# Patient Record
Sex: Male | Born: 1980 | Race: White | Hispanic: No | Marital: Married | Smoking: Never smoker
Health system: Southern US, Community
[De-identification: ages and names within clinical notes are randomized; demographics above are authoritative.]

---

## 2011-09-12 ENCOUNTER — Ambulatory Visit (INDEPENDENT_AMBULATORY_CARE_PROVIDER_SITE_OTHER): Payer: BC Managed Care – PPO | Admitting: Physician Assistant

## 2011-09-12 VITALS — BP 122/80 | HR 125 | Temp 100.8°F | Resp 18 | Ht 71.0 in | Wt 162.0 lb

## 2011-09-12 DIAGNOSIS — R509 Fever, unspecified: Secondary | ICD-10-CM

## 2011-09-12 DIAGNOSIS — J029 Acute pharyngitis, unspecified: Secondary | ICD-10-CM

## 2011-09-12 LAB — POCT RAPID STREP A (OFFICE): Rapid Strep A Screen: NEGATIVE

## 2011-09-12 MED ORDER — AMOXICILLIN 500 MG PO CAPS: ORAL_CAPSULE | ORAL | Status: DC

## 2011-09-12 NOTE — Progress Notes (Signed)
  Subjective:    Patient ID: Oscar Contreras, male    DOB: 1980-08-09, 31 y.o.   MRN: [id number]  HPI 31 yr old CM presents with a 2 day h/o ST and fever up to 103 degrees. +bodyaches. Has an almost 61 yr old daughter that is in daycare.  Review of Systems  All other systems reviewed and are negative.      Objective:   Physical Exam  Nursing note and vitals reviewed. Constitutional: He is oriented to person, place, and time. He appears well-developed and well-nourished.  HENT:  Head: Normocephalic and atraumatic.  Right Ear: External ear normal.  Left Ear: External ear normal.  Mouth/Throat: Oropharyngeal exudate (purulent exudate B, post pharynx open and patent) present.  Neck: Normal range of motion. Neck supple.  Cardiovascular: Regular rhythm, normal heart sounds and intact distal pulses.  Exam reveals no gallop and no friction rub.   No murmur heard.      Tachycardia secondary to fever.  Pulmonary/Chest: Effort normal and breath sounds normal.  Lymphadenopathy:    He has cervical adenopathy (B shotty and tender AC nodes.).  Neurological: He is alert and oriented to person, place, and time.  Skin: Skin is warm.  Psychiatric: He has a normal mood and affect. His behavior is normal.   Results for orders placed in visit on 09/12/11  POCT RAPID STREP A (OFFICE)      Component Value Range   Rapid Strep A Screen Negative  Negative       Assessment & Plan:  Pharyngitis-likely strep. Culturing. advil or tylenol. Salt water gargles. Fluids and rest.

## 2011-09-16 LAB — CULTURE, GROUP A STREP: Organism ID, Bacteria: NORMAL

## 2013-02-28 ENCOUNTER — Other Ambulatory Visit: Payer: Self-pay | Admitting: Family Medicine

## 2013-02-28 DIAGNOSIS — K4090 Unilateral inguinal hernia, without obstruction or gangrene, not specified as recurrent: Secondary | ICD-10-CM

## 2013-03-04 ENCOUNTER — Ambulatory Visit
Admission: RE | Admit: 2013-03-04 | Discharge: 2013-03-04 | Disposition: A | Discharge status: Home / Self Care | Payer: BC Managed Care – PPO | Source: Ambulatory Visit | Attending: Family Medicine | Admitting: Family Medicine

## 2013-03-04 DIAGNOSIS — K4090 Unilateral inguinal hernia, without obstruction or gangrene, not specified as recurrent: Secondary | ICD-10-CM

## 2015-06-14 IMAGING — US US SCROTUM
1 series · 14 of 25 positions shown · non-contrast
Comparison: None.

CLINICAL DATA: 32-year-old male with abdominal pain radiating to
the left scrotum. Initial encounter. Query scrotal hernia or cyst.

EXAM:
ULTRASOUND OF SCROTUM
TECHNIQUE: Complete ultrasound examination of the testicles, epididymis, and
other scrotal structures was performed.

[Series 1: us scrotum · 0.08mm/px · 46 acquisitions, 14 frames shown]
[im 1/46]
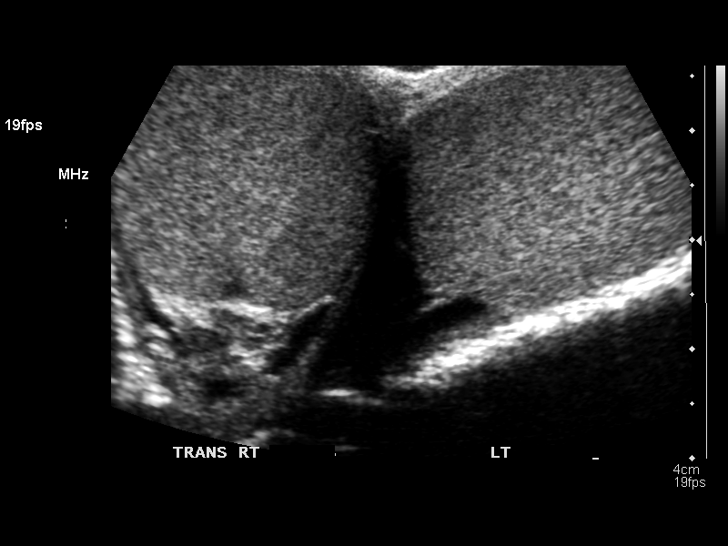
[im 4/46]
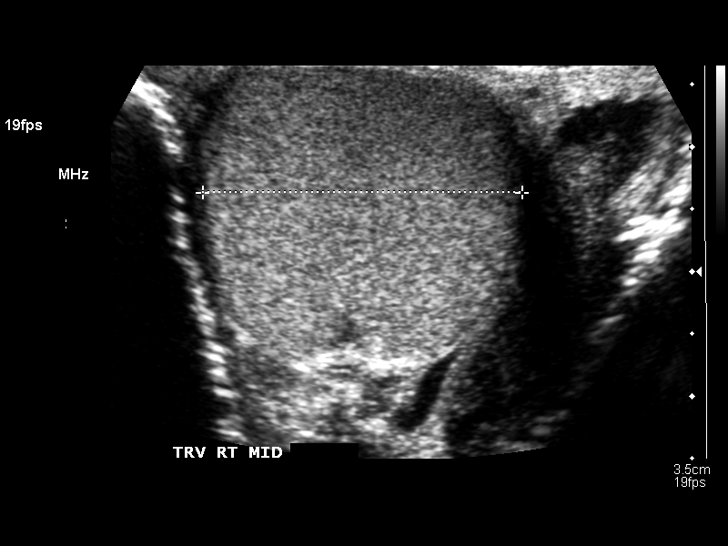
[im 8/46]
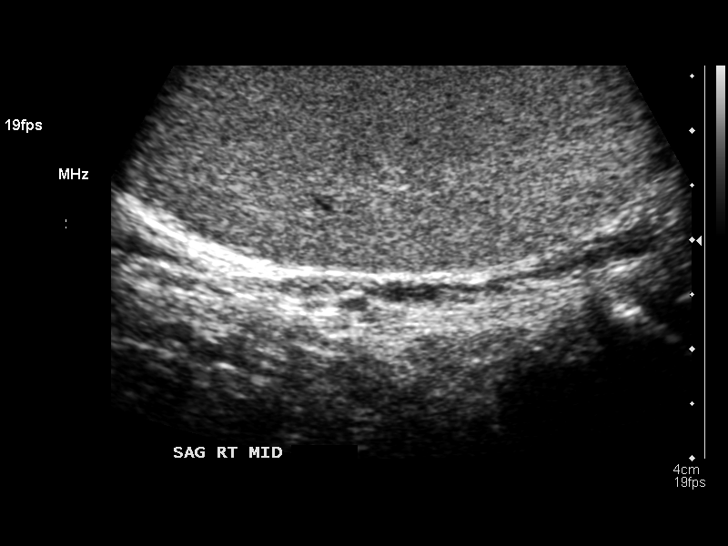
[im 12/46]
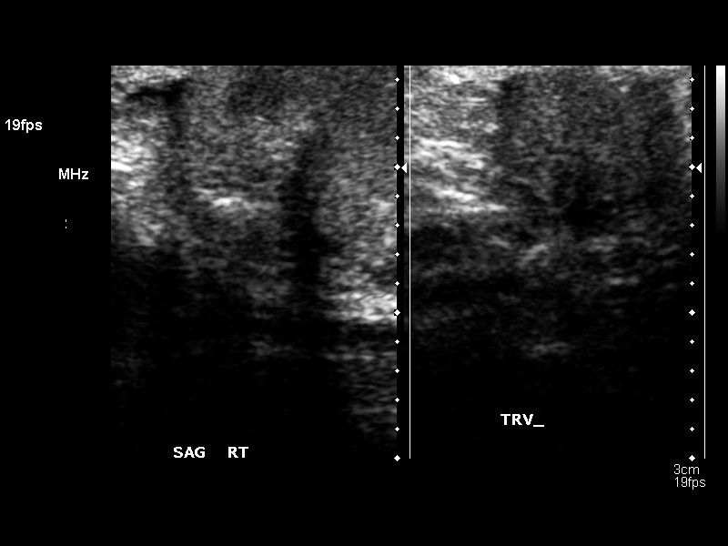
[im 16/46]
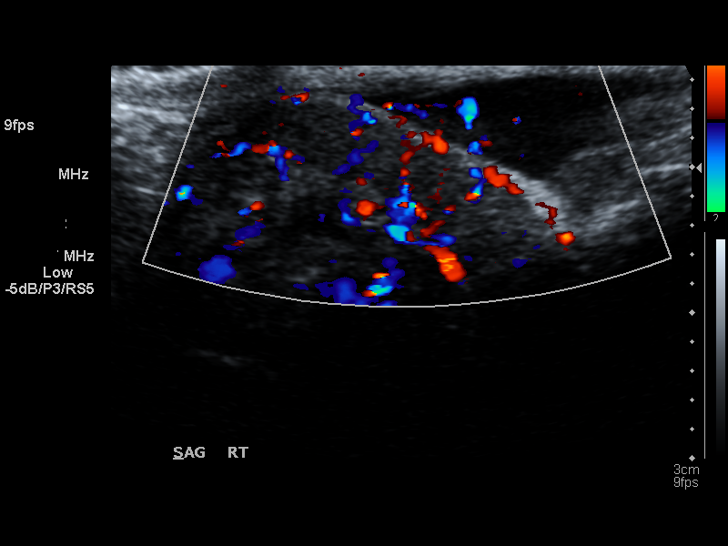
[im 17/46]
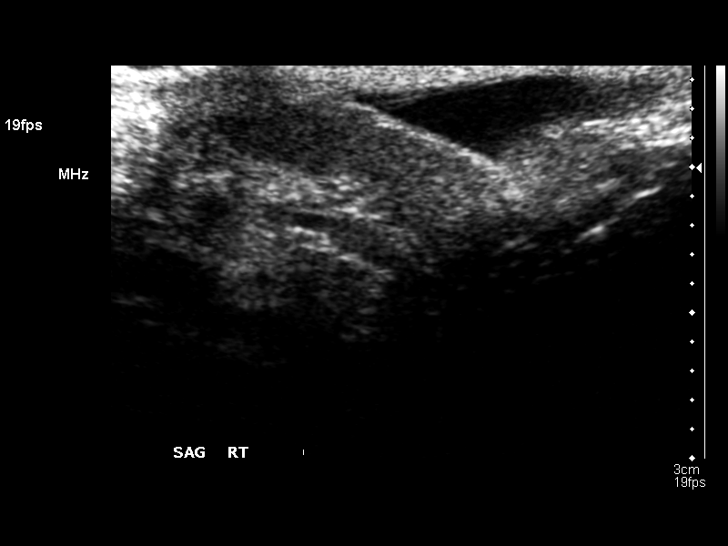
[im 21/46]
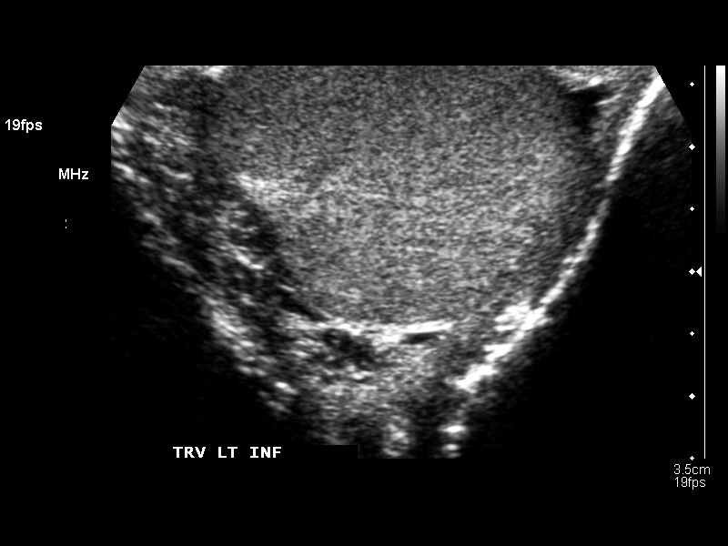
[im 25/46]
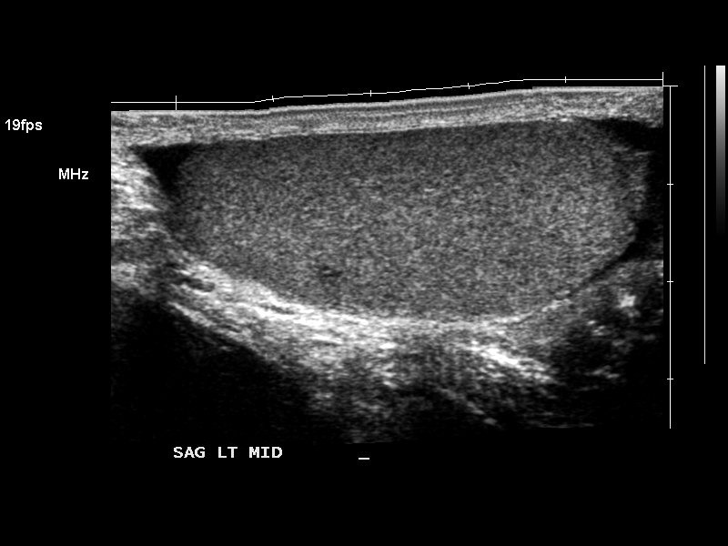
[im 29/46]
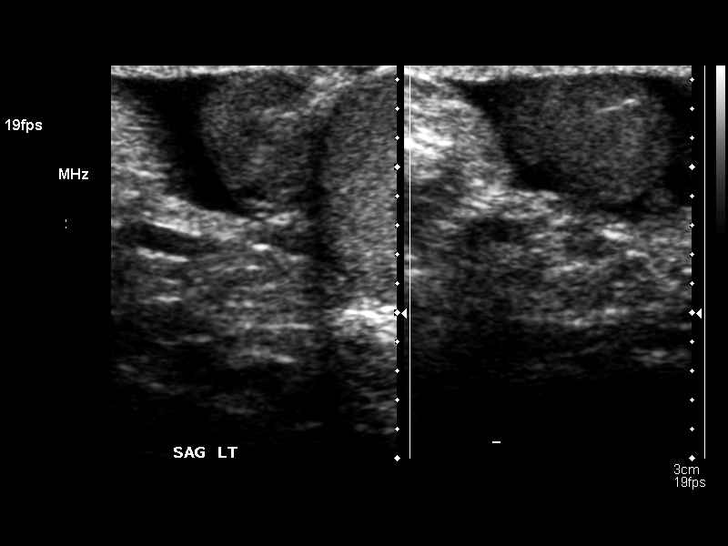
[im 31/46]
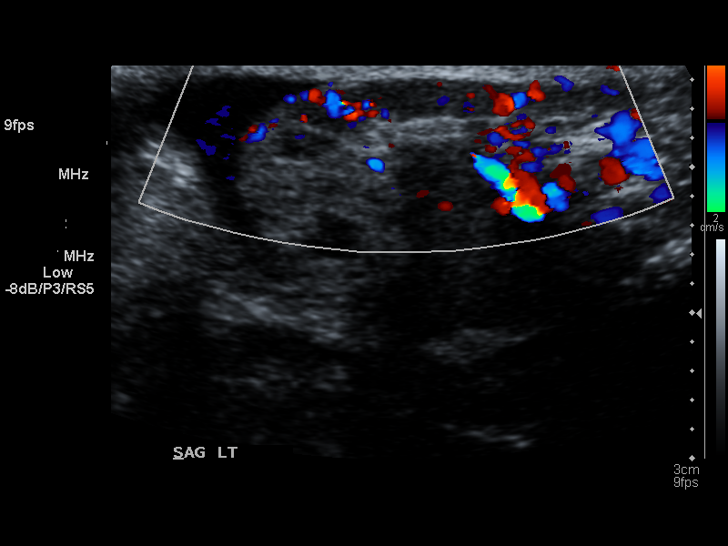
[im 34/46]
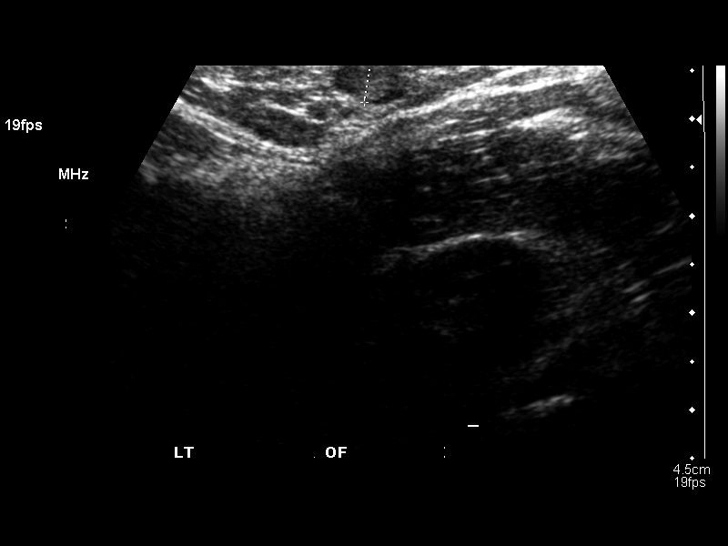
[im 38/46]
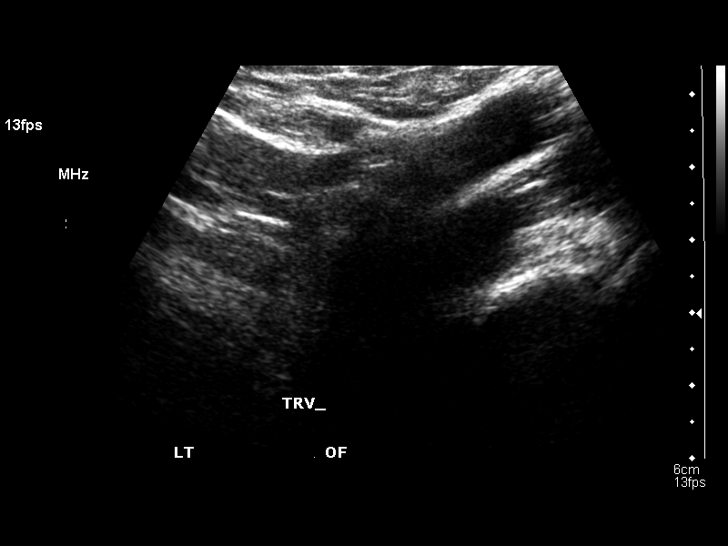
[im 42/46]
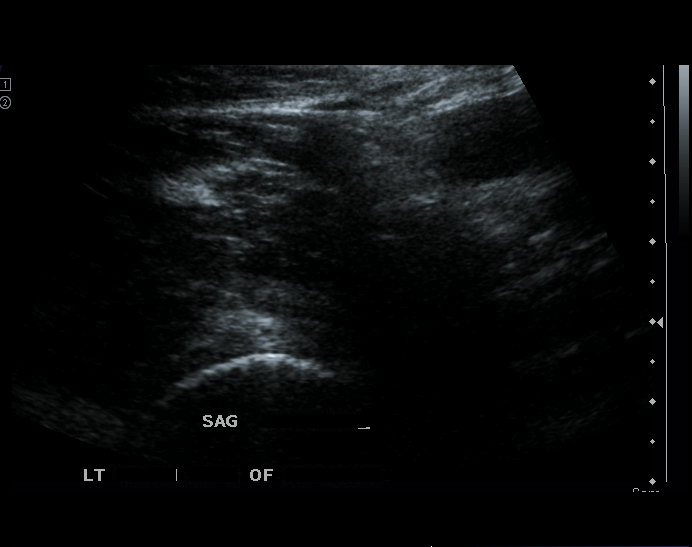
[im 46/46]
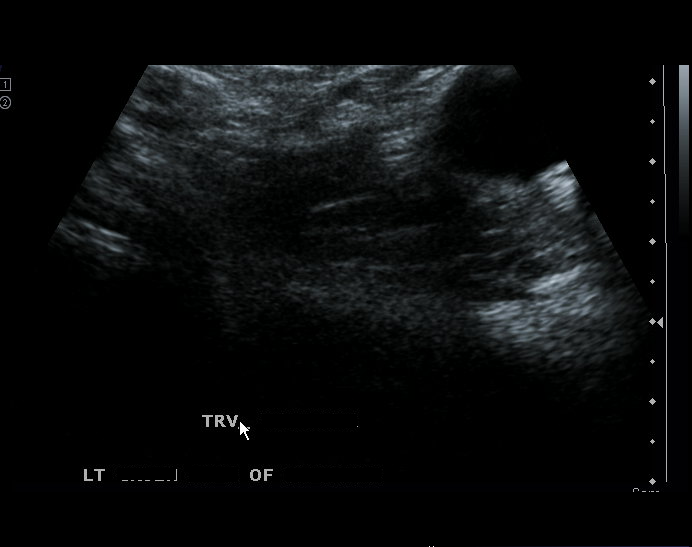

[14 of 25 positions shown; findings below may reference images not displayed]

FINDINGS: Right testicle

Measurements: 4.9 x 2.1 x 2.6 cm. No mass or microlithiasis
visualized.

Left testicle

Measurements: 4.9 x 2.0 x 3.2 cm. No mass or microlithiasis
visualized.

Right epididymis:  Normal in size and appearance.

Left epididymis:  Normal in size and appearance.

Hydrocele:  Trace bilateral.

Varicocele:  None visualized.

Other findings: In the left groin area of clinical concern there are
normal inguinal lymph nodes. Normal fibromuscular architecture. No
cystic or solid mass. No changes with Valsalva. No hernia
identified.
IMPRESSION: 1. Negative scrotal ultrasound.
2. No abnormality of the left groin. No inguinal hernia or mass
identified.

## 2019-08-11 ENCOUNTER — Ambulatory Visit
Admission: EM | Admit: 2019-08-11 | Discharge: 2019-08-11 | Disposition: A | Discharge status: Home / Self Care | Payer: 59 | Attending: Physician Assistant | Admitting: Physician Assistant

## 2019-08-11 ENCOUNTER — Ambulatory Visit (INDEPENDENT_AMBULATORY_CARE_PROVIDER_SITE_OTHER): Payer: 59

## 2019-08-11 ENCOUNTER — Encounter: Payer: Self-pay | Admitting: Physician Assistant

## 2019-08-11 ENCOUNTER — Other Ambulatory Visit: Payer: Self-pay

## 2019-08-11 DIAGNOSIS — S6982XA Other specified injuries of left wrist, hand and finger(s), initial encounter: Secondary | ICD-10-CM | POA: Diagnosis not present

## 2019-08-11 DIAGNOSIS — M79645 Pain in left finger(s): Secondary | ICD-10-CM | POA: Diagnosis not present

## 2019-08-11 NOTE — ED Provider Notes (Signed)
[facility]    CSN: [id number] Arrival date & time: 08/11/19  1530      History   Chief Complaint Chief Complaint  Patient presents with  . Hand Pain    HPI Oscar Contreras is a 39 y.o. male.   39 year old male comes in for 3 week history of left middle finger pain after injury. At the time, finger was wrapped up in a towel that was caught in a spinning drill, causing twisting motion of the finger. Since then has had pain and swelling to the left middle finger. No pain at rest, pain with ROM. Given swelling has not improved, tried to use finger splint, for which caused stiffness. RHD     History reviewed. No pertinent past medical history.  There are no problems to display for this patient.   History reviewed. No pertinent surgical history.     Home Medications    Prior to Admission medications   Not on File    Family History History reviewed. No pertinent family history.  Social History Social History   Tobacco Use  . Smoking status: Never Smoker  Substance Use Topics  . Alcohol use: Not on file  . Drug use: Not on file     Allergies   Patient has no known allergies.   Review of Systems Review of Systems  Reason unable to perform ROS: See HPI as above.     Physical Exam Triage Vital Signs ED Triage Vitals  Enc Vitals Group     BP 08/11/19 1650 (!) 156/104     Pulse Rate 08/11/19 1650 90     Resp 08/11/19 1650 18     Temp 08/11/19 1650 97.9 F (36.6 C)     Temp Source 08/11/19 1650 Oral     SpO2 08/11/19 1650 98 %     Weight --      Height --      Head Circumference --      Peak Flow --      Pain Score 08/11/19 1648 0     Pain Loc --      Pain Edu? --      Excl. in GC? --    No data found.  Updated Vital Signs BP (!) 156/104 (BP Location: Right Arm)   Pulse 90   Temp 97.9 F (36.6 C) (Oral)   Resp 18   SpO2 98%   Physical Exam Constitutional:      General: He is not in acute distress.    Appearance: Normal  appearance. He is well-developed. He is not toxic-appearing or diaphoretic.  HENT:     Head: Normocephalic and atraumatic.  Eyes:     Conjunctiva/sclera: Conjunctivae normal.     Pupils: Pupils are equal, round, and reactive to light.  Pulmonary:     Effort: Pulmonary effort is normal. No respiratory distress.  Musculoskeletal:     Cervical back: Normal range of motion and neck supple.     Comments: Swelling to the PIP joint of left middle finger, no contusion seen. No tenderness to palpation of the finger. Slightly decreased flexion to the area. Strength 5/5. NVI  Skin:    General: Skin is warm and dry.  Neurological:     Mental Status: He is alert and oriented to person, place, and time.      UC Treatments / Results  Labs (all labs ordered are listed, but only abnormal results are displayed) Labs Reviewed - No data to display  EKG   Radiology DG Finger Middle Left  Result Date: 08/11/2019 CLINICAL DATA:  39 year old male with trauma to the left middle finger. EXAM: LEFT MIDDLE FINGER 2+V COMPARISON:  None. FINDINGS: There is no evidence of fracture or dislocation. There is no evidence of arthropathy or other focal bone abnormality. Soft tissues are unremarkable. IMPRESSION: Negative. Electronically Signed   By: Elgie Collard M.D.   On: 08/11/2019 17:35    Procedures Procedures (including critical care time)  Medications Ordered in UC Medications - No data to display  Initial Impression / Assessment and Plan / UC Course  I have reviewed the triage vital signs and the nursing notes.  Pertinent labs & imaging results that were available during my care of the patient were reviewed by me and considered in my medical decision making (see chart for details).    Xray negative. Finger splint, NSAIDs, ice compress. To follow up with hand orthopedics if symptoms still not improving.  Final Clinical Impressions(s) / UC Diagnoses   Final diagnoses:  Finger pain, left     ED Prescriptions    None     PDMP not reviewed this encounter.   Belinda Fisher, PA-C 08/11/19 1801

## 2019-08-11 NOTE — Discharge Instructions (Signed)
As discussed, will give you a call if xray positive. Otherwise, continue finger splint with ibuprofen 800mg  three times day or naproxen 440mg  twice a day for 5-7 days. Follow up with hand orthopedics if symptoms not improving.

## 2019-08-11 NOTE — ED Triage Notes (Addendum)
Patient presents to [facility] for assessment of left middle finger pain x 3 weeks after he got his hand caught in a towel being spun around/wrapped up by a drill/  Swelling noted at triage.  Patient states it was doing okay for 2 weeks, but then he decided to splint it 5 days ago, and the stiffness/decreased ROM/pain worsened

## 2021-11-20 IMAGING — DX DG FINGER MIDDLE 2+V*L*
3 series · 3 of 3 positions shown · non-contrast
Comparison: None.

CLINICAL DATA: 39-year-old male with trauma to the left middle
finger.

EXAM:
LEFT MIDDLE FINGER 2+V

[finger pa (1 of 2)]
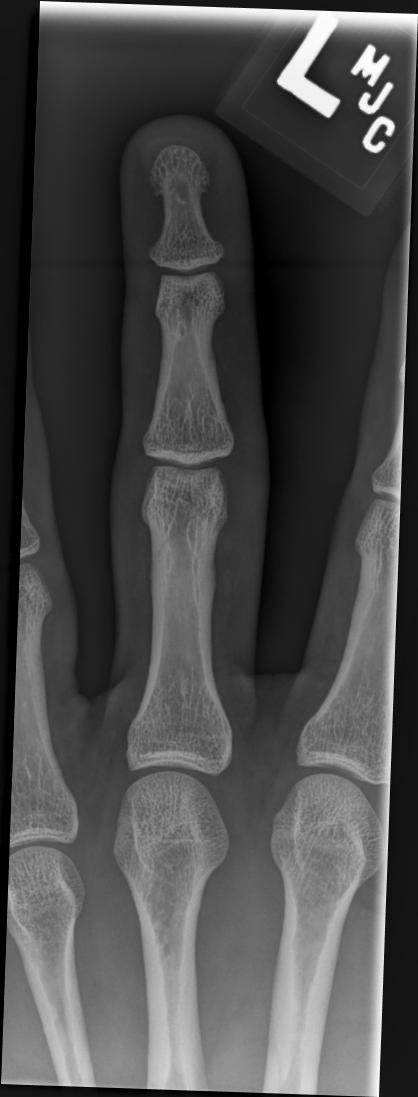

[finger pa (2 of 2)]
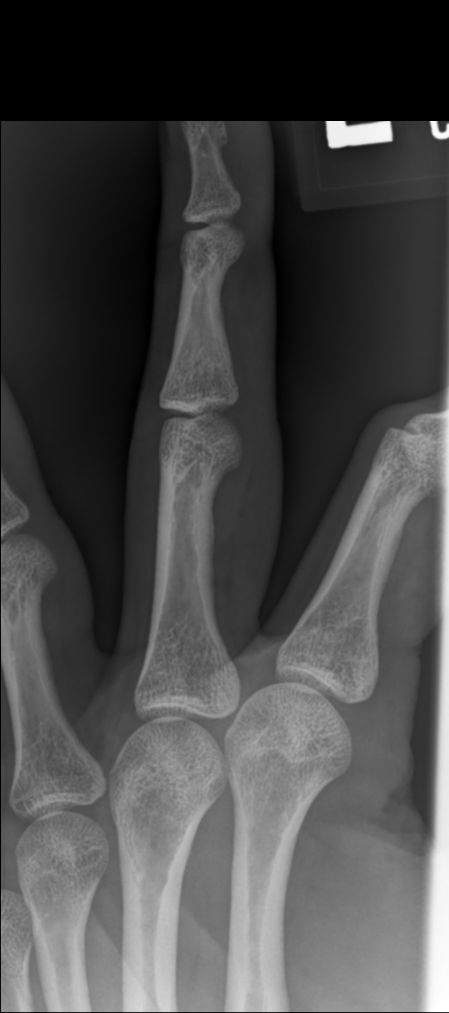

[finger lat]
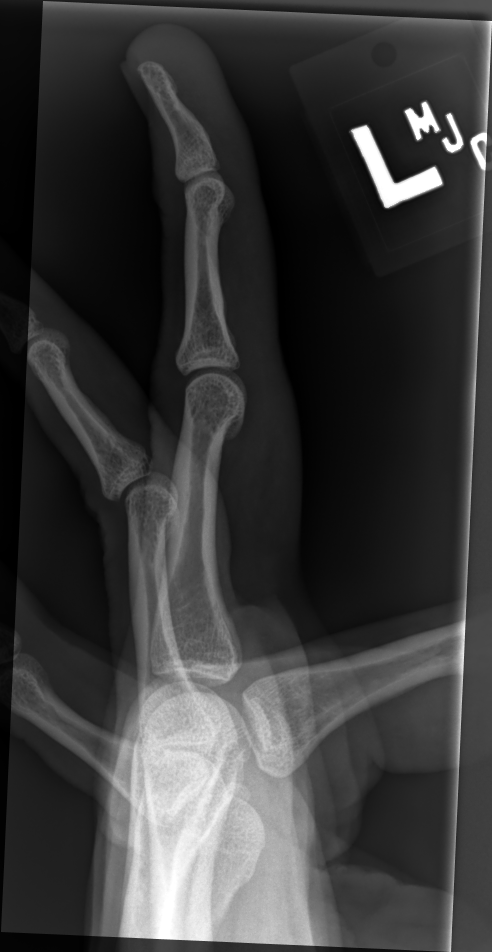

[3 of 3 positions shown; findings below may reference images not displayed]

FINDINGS: There is no evidence of fracture or dislocation. There is no
evidence of arthropathy or other focal bone abnormality. Soft
tissues are unremarkable.
IMPRESSION: Negative.

## 2022-03-08 ENCOUNTER — Encounter: Payer: Self-pay | Admitting: Family Medicine

## 2022-03-09 ENCOUNTER — Ambulatory Visit: Payer: No Typology Code available for payment source | Admitting: Family Medicine

## 2022-03-09 ENCOUNTER — Encounter: Payer: Self-pay | Admitting: Family Medicine

## 2022-03-09 VITALS — BP 118/90 | HR 90 | Temp 97.5°F | Resp 16 | Ht 71.0 in | Wt 199.0 lb

## 2022-03-09 DIAGNOSIS — Z136 Encounter for screening for cardiovascular disorders: Secondary | ICD-10-CM | POA: Diagnosis not present

## 2022-03-09 DIAGNOSIS — Z23 Encounter for immunization: Secondary | ICD-10-CM

## 2022-03-09 DIAGNOSIS — Z1329 Encounter for screening for other suspected endocrine disorder: Secondary | ICD-10-CM

## 2022-03-09 DIAGNOSIS — R030 Elevated blood-pressure reading, without diagnosis of hypertension: Secondary | ICD-10-CM | POA: Diagnosis not present

## 2022-03-09 DIAGNOSIS — N201 Calculus of ureter: Secondary | ICD-10-CM

## 2022-03-09 DIAGNOSIS — Z0001 Encounter for general adult medical examination with abnormal findings: Secondary | ICD-10-CM

## 2022-03-09 DIAGNOSIS — Z13228 Encounter for screening for other metabolic disorders: Secondary | ICD-10-CM

## 2022-03-09 DIAGNOSIS — Z130 Encounter for screening for diseases of the blood and blood-forming organs and certain disorders involving the immune mechanism: Secondary | ICD-10-CM

## 2022-03-09 DIAGNOSIS — Z1321 Encounter for screening for nutritional disorder: Secondary | ICD-10-CM

## 2022-03-09 DIAGNOSIS — R058 Other specified cough: Secondary | ICD-10-CM

## 2022-03-09 DIAGNOSIS — T592X1A Toxic effect of formaldehyde, accidental (unintentional), initial encounter: Secondary | ICD-10-CM

## 2022-03-09 NOTE — Progress Notes (Signed)
Assessment  Assessment/Plan:   Problem List Items Addressed This Visit       Other   Elevated blood pressure reading in office without diagnosis of hypertension - Primary    The patient presented with an initial elevated BP reading of 90 diastolic during the visit. Recommend check at home. If remains elevated, consider a follow-up visit for BP management and guideline-directed medical therapy initiation if hypertension is diagnosed.      Relevant Orders   TSH   Lipid panel   Hemoglobin A1c   Microalbumin / creatinine urine ratio   Urinalysis, Routine w reflex microscopic   Vitamin D 1,25 dihydroxy   CBC with Differential/Platelet   Comprehensive metabolic panel   Encounter for well adult exam with abnormal findings   Relevant Orders   TSH   Lipid panel   Hemoglobin A1c   Microalbumin / creatinine urine ratio   Urinalysis, Routine w reflex microscopic   Vitamin D 1,25 dihydroxy   CBC with Differential/Platelet   Comprehensive metabolic panel   Tdap vaccine greater than or equal to 7yo IM (Completed)   Toxic effect of formalin fumes    Due to occupational exposure to formaldehyde, a discussion on cancer surveillance briefly discussed.  Given improving respiratory symptoms, no previous sign of respiratory disease on exam.  Recommend continued monitoring, with low threshold to refer to pulmonology or to get advanced lung imaging.      Other Visit Diagnoses     Screening for endocrine, nutritional, metabolic and immunity disorder       Relevant Orders   TSH   Lipid panel   Hemoglobin A1c   Microalbumin / creatinine urine ratio   Urinalysis, Routine w reflex microscopic   Vitamin D 1,25 dihydroxy   CBC with Differential/Platelet   Comprehensive metabolic panel   Screening for heart disease       Relevant Orders   TSH   Lipid panel   Hemoglobin A1c   Microalbumin / creatinine urine ratio   Urinalysis, Routine w reflex microscopic   Vitamin D 1,25 dihydroxy   CBC  with Differential/Platelet   Comprehensive metabolic panel   Immunization due       Relevant Orders   Tdap vaccine greater than or equal to 7yo IM (Completed)   Ureterolithiasis       Relevant Orders   TSH   Lipid panel   Hemoglobin A1c   Microalbumin / creatinine urine ratio   Urinalysis, Routine w reflex microscopic   Vitamin D 1,25 dihydroxy   CBC with Differential/Platelet   Comprehensive metabolic panel   Post-viral cough syndrome           Medications Discontinued During This Encounter  Medication Reason   loratadine (CLARITIN) 10 MG tablet     Patient Counseling(The following topics were reviewed and/or handout was given):  -Nutrition: Stressed importance of moderation in sodium/caffeine intake, saturated fat and cholesterol, caloric balance, sufficient intake of fresh fruits, vegetables, and fiber.  -Stressed the importance of regular exercise.   -Substance Abuse: Discussed cessation/primary prevention of tobacco, alcohol, or other drug use; driving or other dangerous activities under the influence; availability of treatment for abuse.   -Injury prevention: Discussed safety belts, safety helmets, smoke detector, smoking near bedding or upholstery.   -Sexuality: Discussed sexually transmitted diseases, partner selection, use of condoms, avoidance of unintended pregnancy and contraceptive alternatives.   -Dental health: Discussed importance of regular tooth brushing, flossing, and dental visits.  -Health maintenance and immunizations reviewed. Please  refer to Health maintenance section.  Return to care in 1 year for next preventative visit.       Subjective:  Chief complaint Encounter date: 03/09/2022  Chief Complaint  Patient presents with   Establish Care    No questions or concerns-  Tdap needed    Chief Complaint: Physical and Establish Care.  History of Present Illness:   Problem 1: The patient mentions post-viral cough since around Thanksgiving which is  intermittently productive but is improving.  Patient denies recent regular physical activity.   Review of Systems: All systems are reviewed with negative findings except for the aforementioned intermittent productive cough post respiratory illness. The patient denies headache, blurry vision, chest pain, shortness of breath, or any other systemic complaints indicating no acute issues at the time of this visit.  Health Maintenance:  Up-to-date on dental health, encouraged to continue annual dental visits. No recent evaluation for vision, recommended to follow up as needed.      No data to display            03/09/2022    3:45 PM  Depression screen PHQ 2/9  Decreased Interest 0  Down, Depressed, Hopeless 0  PHQ - 2 Score 0    There are no preventive care reminders to display for this patient.   PMH:  The following were reviewed and entered/updated in epic: History reviewed. No pertinent past medical history.  Patient Active Problem List   Diagnosis Date Noted   Elevated blood pressure reading in office without diagnosis of hypertension 03/12/2022   Encounter for well adult exam with abnormal findings 03/12/2022   Toxic effect of formalin fumes 03/12/2022    History reviewed. No pertinent surgical history.  Family History  Problem Relation Age of Onset   Cancer Maternal Grandfather    Heart disease Paternal Grandfather     Medications- reviewed and updated Outpatient Medications Prior to Visit  Medication Sig Dispense Refill   loratadine (CLARITIN) 10 MG tablet Take 10 mg by mouth daily.     No facility-administered medications prior to visit.    No Known Allergies  Social History   Socioeconomic History   Marital status: Married    Spouse name: Not on file   Number of children: Not on file   Years of education: Not on file   Highest education level: Not on file  Occupational History   Not on file  Tobacco Use   Smoking status: Never   Smokeless  tobacco: Not on file  Substance and Sexual Activity   Alcohol use: Yes   Drug use: Never   Sexual activity: Yes    Birth control/protection: None    Comment: Vasectomy  Other Topics Concern   Not on file  Social History Narrative   Not on file   Social Determinants of Health   Financial Resource Strain: Not on file  Food Insecurity: Not on file  Transportation Needs: Not on file  Physical Activity: Not on file  Stress: Not on file  Social Connections: Not on file        Objective:  Physical Exam: BP (!) 118/90 (BP Location: Left Arm, Patient Position: Sitting, Cuff Size: Normal)   Pulse 90   Temp (!) 97.5 F (36.4 C) (Oral)   Resp 16   Ht 5' 11"$  (1.803 m)   Wt 199 lb (90.3 kg)   SpO2 99%   BMI 27.75 kg/m   Body mass index is 27.75 kg/m. Wt Readings from Last 3 Encounters:  03/09/22 199 lb (90.3 kg)  09/12/11 162 lb (73.5 kg)    Gen: NAD, resting comfortably CV: RRR with no murmurs appreciated Pulm: NWOB, CTAB with no crackles, wheezes, or rhonchi GI: Normal bowel sounds present. Soft, Nontender, Nondistended. MSK: no edema, cyanosis, or clubbing noted Skin: warm, dry Neuro: grossly normal, moves all extremities Psych: Normal affect and thought content      At today's visit, we discussed treatment options, associated risk and benefits, and engage in counseling as needed.  Additionally the following were reviewed: Past medical records, past medical and surgical history, family and social background, as well as relevant laboratory results, imaging findings, and specialty notes, where applicable.  This message was generated using dictation software, and as a result, it may contain unintentional typos or errors.  Nevertheless, extensive effort was made to accurately convey at the pertinent aspects of the patient visit.    There may have been are other unrelated non-urgent complaints, but due to the busy schedule and the amount of time already spent with him, time  does not permit to address these issues at today's visit. Another appointment may have or has been requested to review these additional issues.   Marny Lowenstein, MD, MS

## 2022-03-09 NOTE — Patient Instructions (Signed)
Please go get fasting labs as discussed

## 2022-03-12 DIAGNOSIS — R030 Elevated blood-pressure reading, without diagnosis of hypertension: Secondary | ICD-10-CM | POA: Insufficient documentation

## 2022-03-12 DIAGNOSIS — T592X1A Toxic effect of formaldehyde, accidental (unintentional), initial encounter: Secondary | ICD-10-CM | POA: Insufficient documentation

## 2022-03-12 DIAGNOSIS — Z0001 Encounter for general adult medical examination with abnormal findings: Secondary | ICD-10-CM | POA: Insufficient documentation

## 2022-03-12 NOTE — Assessment & Plan Note (Signed)
Due to occupational exposure to formaldehyde, a discussion on cancer surveillance briefly discussed.  Given improving respiratory symptoms, no previous sign of respiratory disease on exam.  Recommend continued monitoring, with low threshold to refer to pulmonology or to get advanced lung imaging.

## 2022-03-12 NOTE — Assessment & Plan Note (Signed)
The patient presented with an initial elevated BP reading of 90 diastolic during the visit. Recommend check at home. If remains elevated, consider a follow-up visit for BP management and guideline-directed medical therapy initiation if hypertension is diagnosed.

## 2022-03-21 ENCOUNTER — Telehealth: Payer: Self-pay | Admitting: Family Medicine

## 2022-03-21 NOTE — Addendum Note (Signed)
Addended by: Renaee Munda on: 03/21/2022 09:23 AM   Modules accepted: Orders

## 2022-03-21 NOTE — Telephone Encounter (Signed)
Left patient a detailed voice message advising him that labs have been updated so that [facility] can see them on their end and to return call to office if he has any other concerns.

## 2022-03-21 NOTE — Telephone Encounter (Signed)
Caller Name: Daunte Call back phone #: [phone]  Reason for Call: Pt was at [facility] to get his labs drawn they told him there was no order sent in. Please let him know when it is in.

## 2022-03-22 ENCOUNTER — Encounter: Payer: Self-pay | Admitting: Family Medicine

## 2022-03-23 ENCOUNTER — Other Ambulatory Visit: Payer: Self-pay | Admitting: Family Medicine

## 2022-03-30 LAB — URINALYSIS, ROUTINE W REFLEX MICROSCOPIC
Bilirubin, UA: NEGATIVE
Glucose, UA: NEGATIVE
Ketones, UA: NEGATIVE
Leukocytes,UA: NEGATIVE
Nitrite, UA: NEGATIVE
Protein,UA: NEGATIVE
RBC, UA: NEGATIVE
Specific Gravity, UA: 1.007 (ref 1.005–1.030)
Urobilinogen, Ur: 0.2 mg/dL (ref 0.2–1.0)
pH, UA: 6.5 (ref 5.0–7.5)

## 2022-03-30 LAB — CBC WITH DIFFERENTIAL/PLATELET
Basophils Absolute: 0 10*3/uL (ref 0.0–0.2)
Basos: 1 %
EOS (ABSOLUTE): 0.2 10*3/uL (ref 0.0–0.4)
Eos: 4 %
Hematocrit: 45.7 % (ref 37.5–51.0)
Hemoglobin: 15.1 g/dL (ref 13.0–17.7)
Immature Grans (Abs): 0 10*3/uL (ref 0.0–0.1)
Immature Granulocytes: 0 %
Lymphocytes Absolute: 1.7 10*3/uL (ref 0.7–3.1)
Lymphs: 36 %
MCH: 30.3 pg (ref 26.6–33.0)
MCHC: 33 g/dL (ref 31.5–35.7)
MCV: 92 fL (ref 79–97)
Monocytes Absolute: 0.5 10*3/uL (ref 0.1–0.9)
Monocytes: 10 %
Neutrophils Absolute: 2.3 10*3/uL (ref 1.4–7.0)
Neutrophils: 49 %
Platelets: 279 10*3/uL (ref 150–450)
RBC: 4.99 x10E6/uL (ref 4.14–5.80)
RDW: 12.4 % (ref 11.6–15.4)
WBC: 4.8 10*3/uL (ref 3.4–10.8)

## 2022-03-30 LAB — COMPREHENSIVE METABOLIC PANEL
ALT: 39 IU/L (ref 0–44)
AST: 23 IU/L (ref 0–40)
Albumin/Globulin Ratio: 1.5 (ref 1.2–2.2)
Albumin: 4.9 g/dL (ref 4.1–5.1)
Alkaline Phosphatase: 70 IU/L (ref 44–121)
BUN/Creatinine Ratio: 10 (ref 9–20)
BUN: 12 mg/dL (ref 6–24)
Bilirubin Total: 0.4 mg/dL (ref 0.0–1.2)
CO2: 26 mmol/L (ref 20–29)
Calcium: 9.9 mg/dL (ref 8.7–10.2)
Chloride: 101 mmol/L (ref 96–106)
Creatinine, Ser: 1.26 mg/dL (ref 0.76–1.27)
Globulin, Total: 3.2 g/dL (ref 1.5–4.5)
Glucose: 100 mg/dL — ABNORMAL HIGH (ref 70–99)
Potassium: 4.9 mmol/L (ref 3.5–5.2)
Sodium: 139 mmol/L (ref 134–144)
Total Protein: 8.1 g/dL (ref 6.0–8.5)
eGFR: 74 mL/min/{1.73_m2} (ref >59)

## 2022-03-30 LAB — VITAMIN D 1,25 DIHYDROXY
Vitamin D 1, 25 (OH)2 Total: 47 pg/mL
Vitamin D2 1, 25 (OH)2: <10 pg/mL
Vitamin D3 1, 25 (OH)2: 47 pg/mL

## 2022-03-30 LAB — MICROALBUMIN / CREATININE URINE RATIO
Creatinine, Urine: 35.3 mg/dL
Microalb/Creat Ratio: <8 mg/g creat (ref 0–29)
Microalbumin, Urine: <3 ug/mL

## 2022-03-30 LAB — HEMOGLOBIN A1C
Est. average glucose Bld gHb Est-mCnc: 114 mg/dL
Hgb A1c MFr Bld: 5.6 % (ref 4.8–5.6)

## 2022-03-30 LAB — LIPID PANEL
Chol/HDL Ratio: 3.8 ratio (ref 0.0–5.0)
Cholesterol, Total: 195 mg/dL (ref 100–199)
HDL: 52 mg/dL (ref >39)
LDL Chol Calc (NIH): 127 mg/dL — ABNORMAL HIGH (ref 0–99)
Triglycerides: 90 mg/dL (ref 0–149)
VLDL Cholesterol Cal: 16 mg/dL (ref 5–40)

## 2022-03-30 LAB — TSH: TSH: 0.829 u[IU]/mL (ref 0.450–4.500)
